# Patient Record
Sex: Male | Born: 1985 | Race: White | Hispanic: No | Marital: Married | Smoking: Current every day smoker
Health system: Southern US, Community
[De-identification: ages and names within clinical notes are randomized; demographics above are authoritative.]

## PROBLEM LIST (undated history)

## (undated) DIAGNOSIS — M75100 Unspecified rotator cuff tear or rupture of unspecified shoulder, not specified as traumatic: Secondary | ICD-10-CM

## (undated) DIAGNOSIS — M24419 Recurrent dislocation, unspecified shoulder: Secondary | ICD-10-CM

## (undated) DIAGNOSIS — R569 Unspecified convulsions: Principal | ICD-10-CM

## (undated) DIAGNOSIS — IMO0002 Reserved for concepts with insufficient information to code with codable children: Secondary | ICD-10-CM

## (undated) HISTORY — DX: Reserved for concepts with insufficient information to code with codable children: IMO0002

## (undated) HISTORY — DX: Unspecified convulsions: R56.9

---

## 2014-07-31 ENCOUNTER — Emergency Department (INDEPENDENT_AMBULATORY_CARE_PROVIDER_SITE_OTHER): Payer: Managed Care, Other (non HMO)

## 2014-07-31 ENCOUNTER — Emergency Department (HOSPITAL_COMMUNITY): Payer: Self-pay

## 2014-07-31 ENCOUNTER — Encounter (HOSPITAL_COMMUNITY): Payer: Self-pay | Admitting: Emergency Medicine

## 2014-07-31 ENCOUNTER — Emergency Department (INDEPENDENT_AMBULATORY_CARE_PROVIDER_SITE_OTHER)
Admission: EM | Admit: 2014-07-31 | Discharge: 2014-07-31 | Disposition: A | Discharge status: Home / Self Care | Payer: Managed Care, Other (non HMO) | Source: Home / Self Care | Attending: Family Medicine | Admitting: Family Medicine

## 2014-07-31 DIAGNOSIS — S4991XA Unspecified injury of right shoulder and upper arm, initial encounter: Secondary | ICD-10-CM

## 2014-07-31 DIAGNOSIS — S43004A Unspecified dislocation of right shoulder joint, initial encounter: Secondary | ICD-10-CM

## 2014-07-31 DIAGNOSIS — S4990XA Unspecified injury of shoulder and upper arm, unspecified arm, initial encounter: Secondary | ICD-10-CM

## 2014-07-31 HISTORY — DX: Unspecified rotator cuff tear or rupture of unspecified shoulder, not specified as traumatic: M75.100

## 2014-07-31 HISTORY — DX: Recurrent dislocation, unspecified shoulder: M24.419

## 2014-07-31 MED ORDER — LORAZEPAM 2 MG/ML IJ SOLN
2.0000 mg | Freq: Once | INTRAMUSCULAR | Status: AC
  Administered 2014-07-31: 2 mg via INTRAMUSCULAR

## 2014-07-31 MED ORDER — MORPHINE SULFATE 2 MG/ML IJ SOLN
4.0000 mg | Freq: Once | INTRAMUSCULAR | Status: AC
  Administered 2014-07-31: 4 mg via INTRAMUSCULAR

## 2014-07-31 MED ORDER — MORPHINE SULFATE 2 MG/ML IJ SOLN
4.0000 mg | Freq: Once | INTRAMUSCULAR | Status: AC
  Administered 2014-07-31: 4 mg via INTRAVENOUS

## 2014-07-31 MED ORDER — MORPHINE SULFATE 2 MG/ML IJ SOLN
INTRAMUSCULAR | Status: AC
  Filled 2014-07-31: qty 2

## 2014-07-31 MED ORDER — LORAZEPAM 2 MG/ML IJ SOLN
INTRAMUSCULAR | Status: AC
  Filled 2014-07-31: qty 1

## 2014-07-31 MED ORDER — LORAZEPAM 1 MG PO TABS: 1.0000 mg | ORAL_TABLET | Freq: Three times a day (TID) | ORAL | Status: DC | PRN

## 2014-07-31 MED ORDER — LORAZEPAM 2 MG/ML IJ SOLN: 2.0000 mg | Freq: Once | INTRAMUSCULAR | Status: DC

## 2014-07-31 NOTE — ED Notes (Signed)
Reports dislocating right shoulder about an hour ago while working on a car.   Hx of rotator cuff injury.

## 2014-07-31 NOTE — Discharge Instructions (Signed)
You dislocated your shoulder. This has successfully been put back in its proper place. Please use the Ativan if the shoulder becomes dislocated again and you're able to put it back in on your own. Please keep the sling on until you see Dr. Ophelia CharterYates. His call his office in the morning to set up an appointment. Please take ibuprofen 600 mg every 6 hours for pain and inflammation. Please apply ice intermittently to your shoulder for the next 24-48 hours.

## 2014-07-31 NOTE — ED Provider Notes (Addendum)
CSN: [id number]     Arrival date & time 07/31/14  1724 History   First MD Initiated Contact with Patient 07/31/14 1748     Chief Complaint  Patient presents with  . Shoulder Injury    dislocated right shoulder.   (Consider location/radiation/quality/duration/timing/severity/associated sxs/prior Treatment) HPI  R shoulder pain: started 1.5 hrs ago. Pt was working on a car and reports pulling down on a wrench w/ his arm in a winged position and felt the shoulder pop out. This is similar to his previous episodes but reports inability this time to pop back in. Immediately painful. Pain is continuous. Has not taken anything for the pain.    History reviewed. No pertinent past medical history. History reviewed. No pertinent past surgical history. History reviewed. No pertinent family history. History  Substance Use Topics  . Smoking status: Current Every Day Smoker -- 0.50 packs/day    Types: Cigarettes  . Smokeless tobacco: Not on file  . Alcohol Use: No    Review of Systems Per HPI with all other pertinent systems negative.   Allergies  Review of patient's allergies indicates no known allergies.  Home Medications   Prior to Admission medications   Not on File   BP 162/110 mmHg  Pulse 72  Temp(Src) 98.3 F (36.8 C) (Oral)  Resp 22  SpO2 100% Physical Exam  Constitutional: He is oriented to person, place, and time. He appears well-developed and well-nourished. He appears distressed.  HENT:  Head: Normocephalic and atraumatic.  Eyes: EOM are normal. Pupils are equal, round, and reactive to light.  Neck: Normal range of motion.  Cardiovascular: Normal rate, normal heart sounds and intact distal pulses.   Pulmonary/Chest: Effort normal and breath sounds normal.  Abdominal: Soft. Bowel sounds are normal.  Musculoskeletal:  Right anterior shoulder distally and anteriorly displaced. Patient unwilling to move arm secondary to pain. Distal pulses, sensation intact. Grip strength  right hand 5 out of 5  Neurological: He is alert and oriented to person, place, and time.  Skin: Skin is warm. He is not diaphoretic.  Psychiatric: He has a normal mood and affect. His behavior is normal. Judgment and thought content normal.   After reduction R shoulder FROM  ED Course  Procedures (including critical care time) Labs Review Labs Reviewed - No data to display  Imaging Review No results found.   MDM   1. Shoulder injury    Anterior shoulder dislocation: 4 mg morphine IM and 2 mg Ativan IM given. An additional 4 mg of morphine was given IM. After manipulation by myself and the patient manipulation after leaving the room. Shoulder went back into place. This was confirmed by x-ray. Discussed case with Dr. Kevan NyGates at [facility] who agrees the patient needs follow-up care and possible surgical intervention at this point. Patient placed in right sling and start this on until he follows up with Dr. Ophelia CharterYates. Patients take NSAIDs for improvement. Ice.  HTN: no previous history. Likely secondary to extreme pain.    Precautions given and all questions answered   Shelly Flattenavid Foy Mungia, MD Family Medicine 07/31/2014, 7:41 PM    Ozella Rocksavid J Austan Nicholl, MD 07/31/14 1941  Ozella Rocksavid J Roberto Romanoski, MD 07/31/14 1946

## 2014-08-14 ENCOUNTER — Emergency Department (HOSPITAL_COMMUNITY): Payer: Managed Care, Other (non HMO)

## 2014-08-14 ENCOUNTER — Emergency Department (HOSPITAL_COMMUNITY)
Admission: EM | Admit: 2014-08-14 | Discharge: 2014-08-15 | Disposition: A | Discharge status: Home / Self Care | Payer: Managed Care, Other (non HMO) | Attending: Emergency Medicine | Admitting: Emergency Medicine

## 2014-08-14 ENCOUNTER — Encounter (HOSPITAL_COMMUNITY): Payer: Self-pay | Admitting: Emergency Medicine

## 2014-08-14 DIAGNOSIS — S43014A Anterior dislocation of right humerus, initial encounter: Secondary | ICD-10-CM | POA: Diagnosis not present

## 2014-08-14 DIAGNOSIS — R410 Disorientation, unspecified: Secondary | ICD-10-CM | POA: Insufficient documentation

## 2014-08-14 DIAGNOSIS — Y998 Other external cause status: Secondary | ICD-10-CM | POA: Diagnosis not present

## 2014-08-14 DIAGNOSIS — Y939 Activity, unspecified: Secondary | ICD-10-CM | POA: Diagnosis not present

## 2014-08-14 DIAGNOSIS — S4991XA Unspecified injury of right shoulder and upper arm, initial encounter: Secondary | ICD-10-CM | POA: Diagnosis present

## 2014-08-14 DIAGNOSIS — R400 Somnolence: Secondary | ICD-10-CM | POA: Insufficient documentation

## 2014-08-14 DIAGNOSIS — R569 Unspecified convulsions: Secondary | ICD-10-CM

## 2014-08-14 DIAGNOSIS — W06XXXA Fall from bed, initial encounter: Secondary | ICD-10-CM | POA: Diagnosis not present

## 2014-08-14 DIAGNOSIS — Y92009 Unspecified place in unspecified non-institutional (private) residence as the place of occurrence of the external cause: Secondary | ICD-10-CM | POA: Insufficient documentation

## 2014-08-14 DIAGNOSIS — Z720 Tobacco use: Secondary | ICD-10-CM | POA: Insufficient documentation

## 2014-08-14 LAB — BASIC METABOLIC PANEL
Anion gap: 7 (ref 5–15)
BUN: 8 mg/dL (ref 6–23)
CALCIUM: 9.1 mg/dL (ref 8.4–10.5)
CO2: 30 mmol/L (ref 19–32)
CREATININE: 0.88 mg/dL (ref 0.50–1.35)
Chloride: 99 mmol/L (ref 96–112)
GFR calc Af Amer: >90 mL/min (ref >90)
GFR calc non Af Amer: >90 mL/min (ref >90)
Glucose, Bld: 121 mg/dL — ABNORMAL HIGH (ref 70–99)
Potassium: 4.3 mmol/L (ref 3.5–5.1)
Sodium: 136 mmol/L (ref 135–145)

## 2014-08-14 LAB — CBC
HCT: 42.7 % (ref 39.0–52.0)
Hemoglobin: 14.5 g/dL (ref 13.0–17.0)
MCH: 29.3 pg (ref 26.0–34.0)
MCHC: 34 g/dL (ref 30.0–36.0)
MCV: 86.3 fL (ref 78.0–100.0)
PLATELETS: 177 10*3/uL (ref 150–400)
RBC: 4.95 MIL/uL (ref 4.22–5.81)
RDW: 13.7 % (ref 11.5–15.5)
WBC: 12.1 10*3/uL — AB (ref 4.0–10.5)

## 2014-08-14 LAB — CBG MONITORING, ED: GLUCOSE-CAPILLARY: 104 mg/dL — AB (ref 70–99)

## 2014-08-14 MED ORDER — LORAZEPAM 2 MG/ML IJ SOLN
0.5000 mg | Freq: Once | INTRAMUSCULAR | Status: AC
  Administered 2014-08-14: 0.5 mg via INTRAVENOUS
  Filled 2014-08-14: qty 1

## 2014-08-14 MED ORDER — HYDROMORPHONE HCL 1 MG/ML IJ SOLN
0.5000 mg | Freq: Once | INTRAMUSCULAR | Status: AC
Start: 2014-08-14 — End: 2014-08-14
  Administered 2014-08-14: 0.5 mg via INTRAVENOUS
  Filled 2014-08-14: qty 1

## 2014-08-14 MED ORDER — HYDROMORPHONE HCL 1 MG/ML IJ SOLN
1.0000 mg | Freq: Once | INTRAMUSCULAR | Status: AC
  Administered 2014-08-14: 1 mg via INTRAVENOUS
  Filled 2014-08-14: qty 1

## 2014-08-14 NOTE — ED Notes (Signed)
Dr Romeo AppleHarrison notified of pain med administration.

## 2014-08-14 NOTE — ED Notes (Addendum)
Patient states that he was a home and had a seizure while watching a movie.  Girlfriend states that he was convulsing, foaming at the mouth, eyes open.  Patient fell off bed.  Patient has a previous injury dislocated right shoulder.  Patient states that he has had one before, a few years ago.  Patient does not have a seizure diagnosis.  Patient also has his right shoulder dislocated at this time.

## 2014-08-14 NOTE — ED Provider Notes (Signed)
CSN: [id number]     Arrival date & time 08/14/14  2111 History   First MD Initiated Contact with Patient 08/14/14 2141     Chief Complaint  Patient presents with  . Seizures  . Shoulder Injury     (Consider location/radiation/quality/duration/timing/severity/associated sxs/prior Treatment) Patient is a 29 y.o. male presenting with seizures and shoulder injury. The history is provided by the patient.  Seizures Seizure activity on arrival: no   Seizure type:  Grand mal Initial focality:  None Episode characteristics: generalized shaking   Postictal symptoms: confusion and somnolence   Return to baseline: yes   Severity:  Mild Duration:  1 minute Timing:  Once Number of seizures this episode:  1 Progression:  Resolved Context comment:  Unknown Recent head injury:  No recent head injuries PTA treatment:  None History of seizures: yes   Similar to previous episodes: yes   Shoulder Injury This is a recurrent problem. The current episode started 1 to 2 hours ago. Episode frequency: once. The problem has not changed since onset.Pertinent negatives include no chest pain, no abdominal pain, no headaches and no shortness of breath. Nothing aggravates the symptoms. Nothing relieves the symptoms. He has tried nothing for the symptoms. The treatment provided no relief.    Past Medical History  Diagnosis Date  . Rotator cuff tear   . Shoulder dislocation, recurrent    History reviewed. No pertinent past surgical history. Family History  Problem Relation Age of Onset  . Adopted: Yes   History  Substance Use Topics  . Smoking status: Current Every Day Smoker -- 0.50 packs/day    Types: Cigarettes  . Smokeless tobacco: Not on file  . Alcohol Use: No    Review of Systems  Constitutional: Negative for fever.  HENT: Negative for drooling and rhinorrhea.   Eyes: Negative for pain.  Respiratory: Negative for cough and shortness of breath.   Cardiovascular: Negative for chest pain and  leg swelling.  Gastrointestinal: Negative for nausea, vomiting, abdominal pain and diarrhea.  Genitourinary: Negative for dysuria and hematuria.  Musculoskeletal: Negative for gait problem and neck pain.  Skin: Negative for color change.  Neurological: Positive for seizures. Negative for numbness and headaches.  Hematological: Negative for adenopathy.  Psychiatric/Behavioral: Negative for behavioral problems.  All other systems reviewed and are negative.     Allergies  Review of patient's allergies indicates no known allergies.  Home Medications   Prior to Admission medications   Medication Sig Start Date End Date Taking? Authorizing Provider  LORazepam (ATIVAN) 1 MG tablet Take 1-2 tablets (1-2 mg total) by mouth every 8 (eight) hours as needed for anxiety (shoulder dislocation). 07/31/14   Ozella Rocks, MD   BP 132/86 mmHg  Pulse 65  Temp(Src) 98 F (36.7 C) (Oral)  Resp 16  SpO2 100% Physical Exam  Constitutional: He is oriented to person, place, and time. He appears well-developed and well-nourished.  HENT:  Head: Normocephalic and atraumatic.  Right Ear: External ear normal.  Left Ear: External ear normal.  Nose: Nose normal.  Mouth/Throat: Oropharynx is clear and moist. No oropharyngeal exudate.  Eyes: Conjunctivae and EOM are normal. Pupils are equal, round, and reactive to light.  Neck: Normal range of motion. Neck supple.  Cardiovascular: Normal rate, regular rhythm, normal heart sounds and intact distal pulses.  Exam reveals no gallop and no friction rub.   No murmur heard. Pulmonary/Chest: Effort normal and breath sounds normal. No respiratory distress. He has no wheezes.  Abdominal: Soft.  Bowel sounds are normal. He exhibits no distension. There is no tenderness. There is no rebound and no guarding.  Musculoskeletal: Normal range of motion. He exhibits tenderness. He exhibits no edema.  Tenderness of the right proximal humerus with evidence of anterior  shoulder dislocation.  Normal motor skills of the right hand. Normal sensation in the right upper extremity. 2+ distal pulses in the upper extremities.  Neurological: He is alert and oriented to person, place, and time.  alert, oriented x3 speech: normal in context and clarity memory: intact grossly cranial nerves II-XII: intact motor strength: full proximally and distally no involuntary movements or tremors sensation: intact to light touch diffusely  cerebellar: finger-to-nose intact gait: normal forwards and backwards   Skin: Skin is warm and dry.  Psychiatric: He has a normal mood and affect. His behavior is normal.  Nursing note and vitals reviewed.   ED Course  Reduction of dislocation Date/Time: 08/15/2014 2:07 PM Performed by: Purvis SheffieldHARRISON, Kellin Fifer Authorized by: Purvis SheffieldHARRISON, Mabell Esguerra Consent: Verbal consent obtained. Written consent obtained. Risks and benefits: risks, benefits and alternatives were discussed Consent given by: patient Patient understanding: patient states understanding of the procedure being performed Patient consent: the patient's understanding of the procedure matches consent given Procedure consent: procedure consent matches procedure scheduled Relevant documents: relevant documents present and verified Test results: test results available and properly labeled Site marked: the operative site was marked Imaging studies: imaging studies available Required items: required blood products, implants, devices, and special equipment available Patient identity confirmed: verbally with patient, arm band, provided demographic data and hospital-assigned identification number Time out: Immediately prior to procedure a "time out" was called to verify the correct patient, procedure, equipment, support staff and site/side marked as required. Preparation: Patient was prepped and draped in the usual sterile fashion. Local anesthesia used: no Patient sedated: yes Sedation type:  moderate (conscious) sedation Sedatives: etomidate Sedation start date/time: 08/15/2014 12:35 PM Sedation end date/time: 08/15/2014 12:44 PM Vitals: Vital signs were monitored during sedation. Patient tolerance: Patient tolerated the procedure well with no immediate complications   (including critical care time) Labs Review Labs Reviewed  CBC - Abnormal; Notable for the following:    WBC 12.1 (*)    All other components within normal limits  BASIC METABOLIC PANEL - Abnormal; Notable for the following:    Glucose, Bld 121 (*)    All other components within normal limits  CBG MONITORING, ED - Abnormal; Notable for the following:    Glucose-Capillary 104 (*)    All other components within normal limits    Imaging Review Dg Shoulder Right  08/15/2014   CLINICAL DATA:  Status post reduction of right humeral head dislocation. Subsequent encounter.  EXAM: RIGHT SHOULDER - 2+ VIEW  COMPARISON:  Right shoulder radiographs performed 08/14/2014  FINDINGS: There has been successful reduction of the right humeral head. A prominent Hill-Sachs lesion is seen. No definite osseous Bankart lesion is identified.  The right acromioclavicular joint is unremarkable in appearance. The visualized portions of the right lung are clear. No significant soft tissue abnormalities are characterized on radiograph.  IMPRESSION: Successful reduction of right humeral head, with prominent Hill-Sachs lesion. No definite osseous Bankart lesion seen.   Electronically Signed   By: Roanna RaiderJeffery  Chang M.D.   On: 08/15/2014 02:28   Dg Shoulder Right  08/14/2014   CLINICAL DATA:  Larey SeatFell out of bed during seizure.  Shoulder pain.  EXAM: RIGHT SHOULDER - 2+ VIEW  COMPARISON:  07/31/2014  FINDINGS: There is an anterior  shoulder dislocation. No evidence for acute fracture. Normal alignment at the right [facility] joint. Visualized right ribs are intact.  IMPRESSION: Anterior right shoulder dislocation.   Electronically Signed   By: Richarda Overlie M.D.   On:  08/14/2014 23:47   Ct Head Wo Contrast  08/15/2014   CLINICAL DATA:  Seizure.  EXAM: CT HEAD WITHOUT CONTRAST  TECHNIQUE: Contiguous axial images were obtained from the base of the skull through the vertex without intravenous contrast.  COMPARISON:  None.  FINDINGS: Skull and Sinuses:Negative for fracture or destructive process. The mastoids, middle ears, and imaged paranasal sinuses are clear of fluid. Prominent right jugular bulb, but likely bone covered.  Orbits: No acute abnormality.  Brain: No evidence of acute infarction, hemorrhage, hydrocephalus, or mass lesion/mass effect. No cortical findings to explain seizure.  IMPRESSION: Negative head CT.   Electronically Signed   By: Marnee Spring M.D.   On: 08/15/2014 00:17     EKG Interpretation   Date/Time:  Tuesday August 14 2014 21:59:03 EST Ventricular Rate:  62 PR Interval:  155 QRS Duration: 87 QT Interval:  396 QTC Calculation: 402 R Axis:   51 Text Interpretation:  Sinus rhythm Early repolarization Confirmed by  Avaline Stillson  MD, Albie Arizpe ([id number]) on 08/14/2014 10:05:02 PM      MDM   Final diagnoses:  Seizure  Anterior shoulder dislocation, right, initial encounter    9:56 PM 29 y.o. male with history of right anterior shoulder dislocations and one previous seizure who presents with a seizure and shoulder dislocation. His girlfriend states that he was lying on the bed with her around 745 this evening when he began shaking all over and fell to the ground. She states that she caught his head. He appears to have dislocated his right shoulder. He has a mild headache, 3 out of 10. He notes that he had one previous seizure 2 years ago while fishing but never sought medical care. He denies any sleep issues or recent head injuries. He states that he is otherwise been well. Screening lab work sent prior to my evaluation. Will get imaging of head and right shoulder.   Several attempts were made at shoulder reduction w/ dilaudid and mild  anxiolysis. These attempts were unsuccessful and ultimately the pt required conscious sedation. The shoulder was easily reduced under conscious sedation. Pt found to have hill sachs lesion. Already has f/u w/ ortho this month. Placed in sling. He remains at baseline after the sedation has worn off. Will rec neuro f/u and advised him no driving or operation of heavy machinery until cleared by a neurologist.  I have discussed the diagnosis/risks/treatment options with the patient and believe the pt to be eligible for discharge home to follow-up with neuro and ortho. We also discussed returning to the ED immediately if new or worsening sx occur. We discussed the sx which are most concerning (e.g., recurrent seizures, AMS, fever) that necessitate immediate return. Medications administered to the patient during their visit and any new prescriptions provided to the patient are listed below.  Medications given during this visit Medications  HYDROmorphone (DILAUDID) injection 1 mg (1 mg Intravenous Given 08/14/14 2211)  LORazepam (ATIVAN) injection 0.5 mg (0.5 mg Intravenous Given 08/14/14 2210)  LORazepam (ATIVAN) injection 0.5 mg (0.5 mg Intravenous Given 08/14/14 2314)  HYDROmorphone (DILAUDID) injection 0.5 mg (0.5 mg Intravenous Given 08/14/14 2314)  etomidate (AMIDATE) injection (8 mg Intravenous Given 08/15/14 0034)    Discharge Medication List as of 08/15/2014 12:21 AM  START taking these medications   Details  oxyCODONE-acetaminophen (PERCOCET) 5-325 MG per tablet Take 1 tablet by mouth every 6 (six) hours as needed for moderate pain., Starting 08/15/2014, Until Discontinued, Print         Purvis Sheffield, MD 08/15/14 [phone]

## 2014-08-14 NOTE — ED Notes (Signed)
Dr Romeo AppleHarrison in room to attempt shoulder relocation

## 2014-08-14 NOTE — ED Notes (Signed)
Dr Harrison in room to attempt shoulder relocation 

## 2014-08-14 NOTE — ED Notes (Signed)
Called x-ray and confirmed that pt is ready for x-ray

## 2014-08-14 NOTE — ED Notes (Signed)
MD at bedside. 

## 2014-08-15 ENCOUNTER — Emergency Department (HOSPITAL_COMMUNITY): Payer: Managed Care, Other (non HMO)

## 2014-08-15 MED ORDER — OXYCODONE-ACETAMINOPHEN 5-325 MG PO TABS: 1.0000 | ORAL_TABLET | Freq: Four times a day (QID) | ORAL | Status: DC | PRN

## 2014-08-15 MED ORDER — ETOMIDATE 2 MG/ML IV SOLN
12.0000 mg | Freq: Once | INTRAVENOUS | Status: DC
  Filled 2014-08-15: qty 10

## 2014-08-15 MED ORDER — ETOMIDATE 2 MG/ML IV SOLN
INTRAVENOUS | Status: AC | PRN
  Administered 2014-08-15: 8 mg via INTRAVENOUS

## 2014-08-15 NOTE — ED Notes (Signed)
Pt verbalized understanding of d/c instructions and prescriptions and has no further questions.

## 2014-08-15 NOTE — ED Notes (Signed)
Dr Norlene Campbelltter in room with Dr Romeo AppleHarrison to attempt relocation with this RN

## 2014-08-15 NOTE — Discharge Instructions (Signed)
Dislocation or Subluxation °Dislocation of a joint occurs when ends of two or more adjacent bones no longer touch each other. A subluxation is a minor form of a dislocation, in which two or more adjacent bones are no longer properly aligned. The most common joints susceptible to a dislocation are the shoulder, kneecap, and fingers.  °SYMPTOMS  °· Sudden pain at the time of injury. °· Noticeable deformity in the area of the joint. °· Limited range of motion. °CAUSES  °· Usually a traumatic injury that stretches or tears ligaments that surround a joint and hold the bones together. °· Condition present at birth (congenital) in which the joint surfaces are shallow or abnormally formed. °· Joint disease such as arthritis or other diseases of ligaments and tissues around a joint. °RISK INCREASES WITH: °· Repeated injury to a joint. °· Previous dislocation of a joint. °· Contact sports (football, rugby, hockey, lacrosse) or sports that require repetitive overhead arm motion (throwing, swimming, volleyball). °· Rheumatoid arthritis. °· Congenital joint condition. °PREVENTION °· Warm up and stretch properly before activity. °· Maintain physical fitness: °¨ Joint flexibility. °¨ Muscle strength and endurance. °¨ Cardiovascular fitness. °· Wear proper protective equipment and ensure correct fit. °· Learn and use proper technique. °PROGNOSIS  °This condition is usually curable with prompt treatment. After the dislocation has been put back in place, the joint may require immobilization with a cast, splint, or sling for 2 to 6 weeks, often followed by strength and stretching exercises that may be performed at home or with a therapist. °RELATED COMPLICATIONS  °· Damage to nearby nerves or major blood vessels, causing numbness, coldness, or paleness. °· Recurrent injury to the joint. °· Arthritis of affected joint. °· Fracture of joint. °TREATMENT °Treatment initially involves realigning the bones (reduction) of the joint.  Reductions should only be performed by someone who is trained in the procedure. After the joint is reduced, medicine and ice should be used to reduce pain and inflammation. The joint may be immobilized to allow for the muscles and ligaments to heal. If a joint is subjected to recurrent dislocations, surgery may be necessary to tighten or replace injured structures. After surgery, stretching and strengthening exercises may be required. These may be performed at home or with a therapist. °MEDICATION  °· Patients may require medicine to help them relax (sedative) or muscle relaxants in order to reduce the joint. °· If pain medicine is necessary, nonsteroidal anti-inflammatory medicines, such as aspirin and ibuprofen, or other minor pain relievers, such as acetaminophen, are often recommended. °· Do not take pain medicine for 7 days before surgery. °· Prescription pain relievers may be necessary. Use only as directed and only as much as you need. °SEEK MEDICAL CARE IF:  °· Symptoms get worse or do not improve despite treatment. °· You have difficulty moving a joint after injury. °· Any extremity becomes numb, pale, or cool after injury. This is an emergency. °· Dislocations or subluxations occur repeatedly. °Document Released: 06/01/2005 Document Revised: 08/24/2011 Document Reviewed: 09/13/2008 °[employer]® Patient Information ©2015 [employer]. This information is not intended to replace advice given to you by your health care provider. Make sure you discuss any questions you have with your health care provider. ° °

## 2014-08-16 ENCOUNTER — Ambulatory Visit (INDEPENDENT_AMBULATORY_CARE_PROVIDER_SITE_OTHER): Payer: Managed Care, Other (non HMO) | Admitting: Neurology

## 2014-08-16 ENCOUNTER — Encounter: Payer: Self-pay | Admitting: Neurology

## 2014-08-16 ENCOUNTER — Telehealth: Payer: Self-pay | Admitting: *Deleted

## 2014-08-16 VITALS — BP 124/79 | HR 56 | Ht 70.5 in | Wt 168.6 lb

## 2014-08-16 DIAGNOSIS — R569 Unspecified convulsions: Secondary | ICD-10-CM

## 2014-08-16 DIAGNOSIS — G40309 Generalized idiopathic epilepsy and epileptic syndromes, not intractable, without status epilepticus: Secondary | ICD-10-CM | POA: Diagnosis not present

## 2014-08-16 DIAGNOSIS — IMO0002 Reserved for concepts with insufficient information to code with codable children: Secondary | ICD-10-CM

## 2014-08-16 HISTORY — DX: Unspecified convulsions: R56.9

## 2014-08-16 HISTORY — DX: Reserved for concepts with insufficient information to code with codable children: IMO0002

## 2014-08-16 MED ORDER — LEVETIRACETAM ER 750 MG PO TB24: ORAL_TABLET | ORAL | Status: DC

## 2014-08-16 NOTE — Progress Notes (Signed)
Provider:  Melvyn Novas, M D  Referring Provider: No ref. provider found Primary Care Physician:  Minda Meo, MD  Chief Complaint  Patient presents with  . NP Bobby Gallagher Seizure    Rm 10, wife    HPI:  Bobby Gallagher is a 29 y.o. male seen here as a referral  from Dr. [name], MD for evaluation of his second seizure.    This patient had his first seizure approximately 2 years ago he was casting a right fitting in the presence of friends and they noticed that he spun around and seemed to have landed on his right shoulder. He was witnessed to have several convulsions for about 4 minutes he was on unresponsive and then slowly came back to. When he finally regained awareness of his situation the paramedics had already been at the site. He may have may have had a prostatectomy for 10-20 minutes afterwards. He was very combative,  feeling threatened in that situation.  Then , 2 days ago,  he suffered his second seizure. The shoulder injury that he reported occurred likely first during his first seizure 2 years ago as he fell onto his right side. He dislocated the shoulder later at his workplace had a non- surgical relocation  and treatment and during Tuesday's  Seizure dislocated again his shoulder and injured it even further. He reports that he has joint pain now aching muscles he also feels fatigue does not get enough sleep or does not feel rested and restored. He reports he is snoring. He has a long-standing history of insomnia which is not bothering him right now. His main problem today is his second seizure so far without any trigger being identified any preceding illness being identified any factors that may have caused it. Laboratory results dated just after midnight on 08-15-14 show an elevated blood Cell count slightly elevated glucose which is expected after a seizure. He received moderate conscious sedation with etomidate. And then his shoulder was relocated once again. He will see  his orthopedist tomorrow morning on 08-17-14.  The seizure occurred at 19:36 on 08-14-14, and would witnessed by his wife, who confirmed that the patient did not have a fever actually had come home at a regular time after work and had gone to bed rather early. He did not get sleep deprived, he did not enjoy any alcohol or any other recreational drugs. There was also no history of being exposed to any flashlights, he had just been watching a movie in the evening. Of course the patient was advised to see a neurologist before driving or operating heavy machinery again.  The patient is adopted, no family history of seizures.   Review of Systems: Out of a complete 14 system review, the patient complains of only the following symptoms, and all other reviewed systems are negative.   History   Social History  . Marital Status: Married    Spouse Name: N/A  . Number of Children: 0  . Years of Education: voc colleg   Occupational History  . [employer]    Social History Main Topics  . Smoking status: Current Every Day Smoker -- 0.50 packs/day    Types: Cigarettes  . Smokeless tobacco: Not on file  . Alcohol Use: No  . Drug Use: Yes    Special: Marijuana     Comment: rare  . Sexual Activity: Yes   Other Topics Concern  . Not on file   Social History Narrative   Caffeine  1-2 cups daily.    Family History  Problem Relation Age of Onset  . Adopted: Yes    Past Medical History  Diagnosis Date  . Rotator cuff tear   . Shoulder dislocation, recurrent   . Seizures 08/16/2014    [name] had a first seizure about springtime 2014 and suffered a second isolated seizure yesterday. Just a week earlier he had been treated for dislocated shoulder and with a seizure fell and we dislocated the injured shoulder. 08-16-14     History reviewed. No pertinent past surgical history.  Current Outpatient Prescriptions  Medication Sig Dispense Refill  . oxyCODONE-acetaminophen (PERCOCET) 5-325 MG per  tablet Take 1 tablet by mouth every 6 (six) hours as needed for moderate pain. 15 tablet 0   No current facility-administered medications for this visit.    Allergies as of 08/16/2014 - Review Complete 08/16/2014  Allergen Reaction Noted  . Tramadol hcl  08/16/2014    Vitals: BP 124/79 mmHg  Pulse 56  Ht 5' 10.5" (1.791 m)  Wt 168 lb 9.6 oz (76.476 kg)  BMI 23.84 kg/m2 Last Weight:  Wt Readings from Last 1 Encounters:  08/16/14 168 lb 9.6 oz (76.476 kg)   Last Height:   Ht Readings from Last 1 Encounters:  08/16/14 5' 10.5" (1.791 m)    Physical exam:  General: The patient is awake, alert and appears not in acute distress. The patient is well groomed. Head: Normocephalic, atraumatic. Neck is supple. Mallampati 3  neck circumference: 14.5 , facial hair.  Cardiovascular:  Regular rate and rhythm, without  murmurs or carotid bruit, and without distended neck veins. Respiratory: Lungs are clear to auscultation. Skin:  Without evidence of edema, or rash Trunk: BMI is elevated and patient  has normal posture.  Neurologic exam : The patient is awake and alert, oriented to place and time.  Memory subjective  described as intact.  There is a normal attention span & concentration ability. Speech is fluent without dysarthria, dysphonia or aphasia.  Mood and affect are appropriate.  Cranial nerves: Pupils are equal and briskly reactive to light. Funduscopic exam without   evidence of pallor or edema. Extraocular movements  in vertical and horizontal planes intact and without nystagmus.  Visual fields by finger perimetry are intact. Hearing to finger rub intact.  Facial sensation intact to fine touch. Facial motor strength is symmetric and tongue and uvula move midline.  Tongue protrusion into either cheek is normal. Shoulder shrug is normal.   Motor exam:   Normal tone ,muscle bulk and symmetric  Strength, except for the injured right upper extremity.   Sensory:  Fine touch,  pinprick and vibration were tested in all extremities. Proprioception was normal.  Coordination: Rapid alternating movements in the fingers/hands were normal. Finger-to-nose maneuver  normal without evidence of ataxia, dysmetria or tremor.  Gait and station: Patient walks without assistive device and is able unassisted to climb up to the exam table.  Strength within normal limits. Stance is stable and normal. Tandem gait is unfragmented. Romberg testing is negative   Deep tendon reflexes: in the  upper and lower extremities are symmetric and intact. Babinski maneuver response is  downgoing.   Assessment:  After physical and neurologic examination, review of laboratory studies, imaging, neurophysiology testing and pre-existing records, assessment is that of 30 minutes.  I explained to the patient today that since he had 2 seizures within less than 2 years I suspect that he will have a seizure disorder. There has  been no trigger found and I hope that we can see through the MRI brain if any anatomical correlation exists. We will have to put special attention to the hippocampal area and the mesial temporal lobe. I advised him that [place] state law requires him not to operate machinery usually for 6 months. There are extensions for patients that experience Lindell Spar only nocturnal seizures, or patients that  have been treated  For focal seizure with medication successfully without return of seizures and agreed to a limited residential daytime driving.  New onset seizure , number 2 in 20 month.  No EEG and no MRI yet done, CT reviewed and report reviewed and discussed with the patient - negative, Labs were negative.    Plan:  Treatment plan and additional workup :  EEG 40 minutes with lights and HV, MRI with and without contrast with seizure protocol.   Rv in 14 days with me, 30 minutes.   Porfirio Mylar Michala Deblanc MD 08/16/2014

## 2014-08-16 NOTE — Telephone Encounter (Signed)
Bobby SmallGloria Gallagher, referral coordinator calling about appt with Dr. Vickey Hugerohmeier today concerning new onset seizures on pt at Dr. Lanell MatarAronson's request.

## 2014-08-21 ENCOUNTER — Ambulatory Visit (INDEPENDENT_AMBULATORY_CARE_PROVIDER_SITE_OTHER): Payer: Managed Care, Other (non HMO) | Admitting: Neurology

## 2014-08-21 DIAGNOSIS — R569 Unspecified convulsions: Secondary | ICD-10-CM

## 2014-08-21 DIAGNOSIS — G40309 Generalized idiopathic epilepsy and epileptic syndromes, not intractable, without status epilepticus: Secondary | ICD-10-CM

## 2014-08-21 DIAGNOSIS — IMO0002 Reserved for concepts with insufficient information to code with codable children: Secondary | ICD-10-CM

## 2014-08-23 ENCOUNTER — Ambulatory Visit (INDEPENDENT_AMBULATORY_CARE_PROVIDER_SITE_OTHER): Payer: Managed Care, Other (non HMO)

## 2014-08-23 DIAGNOSIS — R569 Unspecified convulsions: Secondary | ICD-10-CM | POA: Diagnosis not present

## 2014-08-23 DIAGNOSIS — G40309 Generalized idiopathic epilepsy and epileptic syndromes, not intractable, without status epilepticus: Secondary | ICD-10-CM | POA: Diagnosis not present

## 2014-08-23 DIAGNOSIS — IMO0002 Reserved for concepts with insufficient information to code with codable children: Secondary | ICD-10-CM

## 2014-08-23 MED ORDER — GADOPENTETATE DIMEGLUMINE 469.01 MG/ML IV SOLN: 17.0000 mL | Freq: Once | INTRAVENOUS | Status: AC | PRN

## 2014-08-24 NOTE — Procedures (Signed)
[facility]  EEG (ELECTROENCEPHALOGRAM) REPORT   STUDY DATE:  08-21-14  PATIENT NAME: Bobby Gallagher,  [name]  , DOB 1985-08-03  MRN: [id number]   ORDERING CLINICIAN: Melvyn Novasarmen Wylene Weissman, MD   TECHNOLOGIST:  Yetta BarreJones  TECHNIQUE: Electroencephalogram was recorded utilizing standard 10-20 system of lead placement and reformatted into average and bipolar montages.      RECORDING TIME: This is a prolonged EEG recording at over 40 minutes duration. It includes hyperventilation and photic stimulation.     CLINICAL INFORMATION:  Mr. Baird LyonsCito suffered a first seizure 20 months ago while fishing. It was noted that he had a portion movement he fell onto his right shoulder but his left arm was tonically extended. He was postictally confused the seizure itself was reported as lasting perhaps 2-4 minutes.  A new seizure was now witnessed by his wife 08-18-14. Similar  movements were seen and the patient again fell onto his right shoulder, which was dislocated in this fall.  FINDINGS: EEG  posterior dominant background rhythm of 9 Hz. There is a high amount of beta fast activity noted at the same time a rather low amplitude is seen. This is most likely a benzodiazepine effect. Photic entrainment was noted at 7, 9, 11, and 13 Hz. Photic stimulation did not lead to epileptiform activity asymmetry or focal slowing. As the patient became increasingly drowsy it was noted that bolus central hemispheres produced phase reversal activity this is a sign of sleepiness and not epileptiform. These were nonrhythmic non-periodic discharges. They were followed by K complexes. The patient achieved only a brief period of sleep. His cardiac rhythm remained in normal sinus rhythm at 60 bpm   IMPRESSION:  EEG is normal for wake and sleep .   MRI should  be correlated.        Melvyn Novasarmen Irmgard Rampersaud , MD

## 2014-08-27 NOTE — Telephone Encounter (Signed)
Appt made 08-16-14.

## 2014-09-03 ENCOUNTER — Ambulatory Visit (INDEPENDENT_AMBULATORY_CARE_PROVIDER_SITE_OTHER): Payer: Managed Care, Other (non HMO) | Admitting: Neurology

## 2014-09-03 ENCOUNTER — Encounter: Payer: Self-pay | Admitting: Neurology

## 2014-09-03 VITALS — BP 139/82 | HR 84 | Resp 16 | Ht 70.0 in | Wt 162.6 lb

## 2014-09-03 DIAGNOSIS — G40209 Localization-related (focal) (partial) symptomatic epilepsy and epileptic syndromes with complex partial seizures, not intractable, without status epilepticus: Secondary | ICD-10-CM | POA: Diagnosis not present

## 2014-09-03 MED ORDER — LAMOTRIGINE ER 100 MG PO TB24: 100.0000 mg | ORAL_TABLET | ORAL | Status: AC

## 2014-09-03 MED ORDER — LAMOTRIGINE ER 100 MG PO TB24: 100.0000 mg | ORAL_TABLET | ORAL | Status: DC

## 2014-09-03 MED ORDER — LAMOTRIGINE 42 X 25 MG & 7 X 100 MG PO KIT: 25.0000 mg | PACK | Freq: Every morning | ORAL | Status: DC

## 2014-09-03 MED ORDER — LAMOTRIGINE ER 25 & 50 & 100 MG PO KIT: PACK | ORAL | Status: AC

## 2014-09-03 NOTE — Progress Notes (Signed)
Quick Note:  Pt here for follow up today. Results given by Dr. Vickey Hugerohmeier. ______

## 2014-09-03 NOTE — Addendum Note (Signed)
Addended by: Melvyn NovasHMEIER, Demaya Hardge on: 09/03/2014 03:26 PM   Modules accepted: Orders

## 2014-09-03 NOTE — Patient Instructions (Signed)
Lamotrigine extended-release tablets What is this medicine? LAMOTRIGINE (la MOE Patrecia Pacetri jeen) is used to control seizures in adults and children with epilepsy. This medicine may be used for other purposes; ask your health care provider or pharmacist if you have questions. COMMON BRAND NAME(S): Lamictal XR What should I tell my health care provider before I take this medicine? They need to know if you have any of these conditions: -a history of depression or bipolar disorder -aseptic meningitis during prior use of lamotrigine -folate deficiency -kidney disease -liver disease -suicidal thoughts, plans, or attempt; a previous suicide attempt by you or a family member -an unusual or allergic reaction to lamotrigine or other seizure medicines, other medicines, foods, dyes, or preservatives -pregnant or trying to get pregnant -breast-feeding How should I use this medicine? Take this medicine by mouth with a glass of water. Follow the directions on the prescription label. Swallow these tablets whole. Do not chew, crush, or divide them. If this medicine upsets your stomach, take it with food or milk. Take your doses at regular intervals. Do not take your medicine more often than directed. A special MedGuide will be given to you by the pharmacist with each new prescription and refill. Be sure to read this information carefully each time. Talk to your pediatrician regarding the use of this medicine in children. While this drug may be prescribed for children as young as 28 years of age, precautions do apply. Overdosage: If you think you've taken too much of this medicine contact a poison control center or emergency room at once. Overdosage: If you think you have taken too much of this medicine contact a poison control center or emergency room at once. NOTE: This medicine is only for you. Do not share this medicine with others. What if I miss a dose? If you miss a dose, take it as soon as you can. If it is  almost time for your next dose, take only that dose. Do not take double or extra doses. What may interact with this medicine? -carbamazepine -male hormones, including contraceptive or birth control pills -methotrexate -phenobarbital -phenytoin -primidone -pyrimethamine -rifampin -trimethoprim -valproic acid This list may not describe all possible interactions. Give your health care provider a list of all the medicines, herbs, non-prescription drugs, or dietary supplements you use. Also tell them if you smoke, drink alcohol, or use illegal drugs. Some items may interact with your medicine. What should I watch for while using this medicine? Visit your doctor or health care professional for regular checks on your progress. Wear a [employer] Alert bracelet or necklace. Carry an identification card with information about your condition, medicines, and doctor or health care professional. It is important to take this medicine exactly as directed. When first starting treatment, your dose will need to be adjusted slowly. It may take weeks or months before your dose is stable. You should contact your doctor or health care professional if your seizures get worse or if you have any new types of seizures. Do not stop taking this medicine unless instructed by your doctor or health care professional. Stopping your medicine suddenly can increase your seizures or their severity. Contact your doctor or health care professional right away if you develop a rash while taking this medicine. Rashes may be very severe and sometimes require treatment in the hospital. Deaths from rashes have occurred. Serious rashes occur more often in children than adults taking this medicine. It is more common for these serious rashes to occur during the first  2 months of treatment, but a rash can occur at any time. You may get drowsy, dizzy, or have blurred vision. Do not drive, use machinery, or do anything that needs mental alertness until  you know how this medicine affects you. To reduce dizzy or fainting spells, do not sit or stand up quickly, especially if you are an older patient. Alcohol can increase drowsiness and dizziness. Avoid alcoholic drinks. The use of this medicine may increase the chance of suicidal thoughts or actions. Pay special attention to how you are responding while on this medicine. Any worsening of mood, or thoughts of suicide or dying should be reported to your health care professional right away. Your mouth may get dry. Chewing sugarless gum or sucking hard candy, and drinking plenty of water may help. Contact your doctor if the problem does not go away or is severe. Women who become pregnant while using this medicine may enroll in the [place] American Antiepileptic Drug Pregnancy Registry by calling [phone]. This registry collects information about the safety of antiepileptic drug use during pregnancy. What side effects may I notice from receiving this medicine? Side effects you should report to your doctor or health care professional as soon as possible: -allergic reactions like skin rash, itching or hives, swelling of the face, lips, or tongue -blurred or double vision -difficulty walking or controlling muscle movements -fever -headache, stiff neck, and sensitivity to light -painful sores in the mouth, eyes, or nose -redness, blistering, peeling or loosening of the skin, including inside the mouth -severe muscle pain -swollen lymph glands -uncontrollable eye movements -unusual bruising or bleeding -unusually weak or tired -vomiting -worsening of mood, thoughts or actions of suicide or dying -yellowing of the eyes or skin Side effects that usually do not require medical attention (report to your doctor or health care professional if they continue or are bothersome): -diarrhea, or constipation -difficulty sleeping -nausea -tremors This list may not describe all possible side effects. Call your  doctor for medical advice about side effects. You may report side effects to FDA at 1-800-FDA-1088. Where should I keep my medicine? Keep out of reach of children. Store at room temperature between 15 and 30 degrees C (59 and 86 degrees F). Throw away any unused medicine after the expiration date. NOTE: This sheet is a summary. It may not cover all possible information. If you have questions about this medicine, talk to your doctor, pharmacist, or health care provider.  2015, [employer]/Gold Standard. (2010-06-04 12:18:31)

## 2014-09-03 NOTE — Progress Notes (Signed)
Provider:  Melvyn Novas, M D  Referring Provider: Geoffry Paradise, MD Primary Care Physician:  Minda Meo, MD  Chief Complaint  Patient presents with  . RV Seizures    Rm 10, wife    HPI:  Bobby Gallagher is a 29 y.o. male seen here as a referral  from Dr. [name], MD for evaluation of his second seizure.    This patient had his first seizure approximately 2 years ago he was casting a right fitting in the presence of friends and they noticed that he spun around and seemed to have landed on his right shoulder. He was witnessed to have several convulsions for about 4 minutes he was on unresponsive and then slowly came back to. When he finally regained awareness of his situation the paramedics had already been at the site. He may have may have had a prostatectomy for 10-20 minutes afterwards. He was very combative,  feeling threatened in that situation.  Then , 2 days ago,  he suffered his second seizure. The shoulder injury that he reported occurred likely first during his first seizure 2 years ago as he fell onto his right side. He dislocated the shoulder later at his workplace had a non- surgical relocation  and treatment and during Tuesday's  Seizure dislocated again his shoulder and injured it even further. He reports that he has joint pain now aching muscles he also feels fatigue does not get enough sleep or does not feel rested and restored. He reports he is snoring. He has a long-standing history of insomnia which is not bothering him right now. His main problem today is his second seizure so far without any trigger being identified any preceding illness being identified any factors that may have caused it. Laboratory results dated just after midnight on 08-15-14 show an elevated blood Cell count slightly elevated glucose which is expected after a seizure. He received moderate conscious sedation with etomidate. And then his shoulder was relocated once again. He will see his  orthopedist tomorrow morning on 08-17-14.  The seizure occurred at 19:36 on 08-14-14, and would witnessed by his wife, who confirmed that the patient did not have a fever actually had come home at a regular time after work and had gone to bed rather early. He did not get sleep deprived, he did not enjoy any alcohol or any other recreational drugs. There was also no history of being exposed to any flashlights, he had just been watching a movie in the evening. Of course the patient was advised to see a neurologist before driving or operating heavy machinery again.  The patient is adopted, no family history of seizures.  Assessment:  After physical and neurologic examination, review of laboratory studies, imaging, neurophysiology testing and pre-existing records, assessment is that of 30 minutes.  I explained to the patient today that since he had 2 seizures within less than 2 years I suspect that he will have a seizure disorder. There has been no trigger found and I hope that we can see through the MRI brain if any anatomical correlation exists. We will have to put special attention to the hippocampal area and the mesial temporal lobe. I advised him that [place] state law requires him not to operate machinery usually for 6 months. There are extensions for patients that experience Lindell Spar only nocturnal seizures, or patients that  have been treated  For focal seizure with medication successfully without return of seizures and agreed to a limited residential  daytime driving. New onset seizure , number 2 in 20 month.    Several history 09-03-14, Bobby Gallagher did not tolerate Keppra levetiracetam very well and he feels that is causing him to feel in a unrest he feels to some degree on shorter tempered his EEG was surprisingly and highly normal. The patient achieved only a brief period of sleep and his cardiac rhythm remained in normal sinus rhythm. His MRI was equally normal. Strokes, scars from traumatic brain  injury or 2 more have been ruled out.  My plan is to change Bobby Gallagher from Keppra to Lamictal. A bit less much less likely to cause mood swings, but it will need about 6 weeks to reach a therapeutic level. In addition I would like for the patient to have some low-dose benzodiazepine at home that could be that he could carry even to work with him. I would use would usually use a sublingual Ativan or Xanax for this. These dissolved in the  Buccal tissue.   Review of Systems: Out of a complete 14 system review, the patient complains of only the following symptoms, and all other reviewed systems are negative.  Last 14 days as expected no further seizure activity.   History   Social History  . Marital Status: Married    Spouse Name: N/A  . Number of Children: 0  . Years of Education: voc colleg   Occupational History  . [employer]    Social History Main Topics  . Smoking status: Current Every Day Smoker -- 0.50 packs/day    Types: Cigarettes  . Smokeless tobacco: Not on file  . Alcohol Use: No  . Drug Use: Yes    Special: Marijuana     Comment: rare  . Sexual Activity: Yes   Other Topics Concern  . Not on file   Social History Narrative   Caffeine 1-2 cups daily.    Family History  Problem Relation Age of Onset  . Adopted: Yes    Past Medical History  Diagnosis Date  . Rotator cuff tear   . Shoulder dislocation, recurrent   . Seizures 08/16/2014    Bobby Gallagher had a first seizure about springtime 2014 and suffered a second isolated seizure yesterday. Just a week earlier he had been treated for dislocated shoulder and with a seizure fell and we dislocated the injured shoulder. 08-16-14   . 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 No pertinent past surgical history.  Current Outpatient Prescriptions  Medication Sig Dispense Refill  . Levetiracetam (KEPPRA XR) 750 MG TB24 Take 1 tab q day for 14 days, than increase to bid po. 60 tablet 3   No  current facility-administered medications for this visit.    Allergies as of 09/03/2014 - Review Complete 09/03/2014  Allergen Reaction Noted  . Tramadol hcl  08/16/2014    Vitals: BP 139/82 mmHg  Pulse 84  Resp 16  Ht  (1.778 m)  Wt 162 lb 9.6 oz (73.755 kg)  BMI 23.33 kg/m2 Last Weight:  Wt Readings from Last 1 Encounters:  09/03/14 162 lb 9.6 oz (73.755 kg)   Last Height:   Ht Readings from Last 1 Encounters:  09/03/14  (1.778 m)    Physical exam: unchanged since last visit.   General: The patient is awake, alert and appears not in acute distress. The patient is well groomed. Head: Normocephalic, atraumatic. Neck is supple. Mallampati 3  neck circumference: 14.5 with  facial hair.  Cardiovascular:  Regular rate and rhythm, without  murmurs or carotid bruit, and without distended neck veins. Respiratory: Lungs are clear to auscultation. Skin:  Without evidence of edema, or rash Trunk: BMI is elevated and patient  has normal posture.  Neurologic exam : The patient is awake and alert, oriented to place and time. Memory subjective  described as intact.  There is a normal attention span & concentration ability. Speech is fluent without dysarthria, dysphonia or aphasia.  Mood and affect are appropriate.  Cranial nerves: Pupils are equal and briskly reactive to light. Extraocular movements  in vertical and horizontal planes intact and without nystagmus.  Visual fields by finger perimetry are intact. Hearing to finger rub intact.  Facial sensation intact to fine touch. Facial motor strength is symmetric and tongue and uvula move midline. Tongue protrusion into either cheek is normal. Shoulder shrug is normal.   Motor exam:   Normal tone ,muscle bulk and symmetric  Strength, except for the injured right upper extremity.  Sensory:  Fine touch, pinprick and vibration were tested in all extremities. Proprioception was normal. Coordination: Rapid alternating movements  in the fingers/hands were normal. Finger-to-nose maneuver  normal without evidence of ataxia, dysmetria or tremor. Gait and station: Patient walks without assistive device and is able unassisted to climb up to the exam table. Strength within normal limits. Stance is stable and normal. Tandem gait is unfragmented. Romberg testing is negative  Deep tendon reflexes: in the  upper and lower extremities are symmetric and intact. Babinski maneuver response is downgoing.   Plan:  Treatment plan and additional workup :  Bobby Gallagher had 2 seizures in total and pulse were 20 month apart I predict that he is not at a high risk of having yet another seizure especially since the EEG and MRI were normal. However guidelines suggest that the patient should be placed on an antiepileptic medication after a second seizure. I will change him from levetiracetam Keppra, which  he has discontinued it 2 days ago and start him on Lamictal with a starter pack.   Rv in 2 month with NP or me.   Porfirio Mylararmen Jaysa Kise MD 09/03/2014

## 2014-09-13 ENCOUNTER — Telehealth: Payer: Self-pay | Admitting: *Deleted

## 2014-09-13 NOTE — Telephone Encounter (Signed)
[employer] calling for reasons why patient was out of work, diagnosis for office visit, treatment plans, as well as the status of the form. Refernce # [id number]

## 2014-09-14 NOTE — Telephone Encounter (Signed)
Per Deeann Creeonna S in MR, awaiting payment.

## 2014-09-19 ENCOUNTER — Telehealth: Payer: Self-pay | Admitting: Neurology

## 2014-09-19 NOTE — Telephone Encounter (Signed)
I called and spoke to [facility] [place]  Gave him information re: to pt.  We have seen for seizures.  Had shoulder injury from fall of seizure.  Form to be signed when Dr. Vickey Hugerohmeier returns re: to seizures.  Ortho relating to shoulder injury.

## 2014-09-19 NOTE — Telephone Encounter (Signed)
Faylene MillionVance, RN with [employer] @ [phone], requesting additional information for disability form.  Needing dx's code, treatment plan, OV notes from 08/16/14, and attending Physicians form.  Ref # [id number], please call and adivse.

## 2014-12-04 ENCOUNTER — Ambulatory Visit: Payer: Managed Care, Other (non HMO) | Admitting: Neurology

## 2014-12-04 ENCOUNTER — Telehealth: Payer: Self-pay

## 2014-12-04 NOTE — Telephone Encounter (Signed)
Pt did not show for appt with Dr. Vickey Huger today.

## 2015-03-24 IMAGING — CR DG SHOULDER 2+V*R*
2 series · 2 of 2 positions shown · non-contrast
Comparison: 07/31/2014

CLINICAL DATA: Fell out of bed during seizure.  Shoulder pain.

EXAM:
RIGHT SHOULDER - 2+ VIEW

[x shoulder ap right]
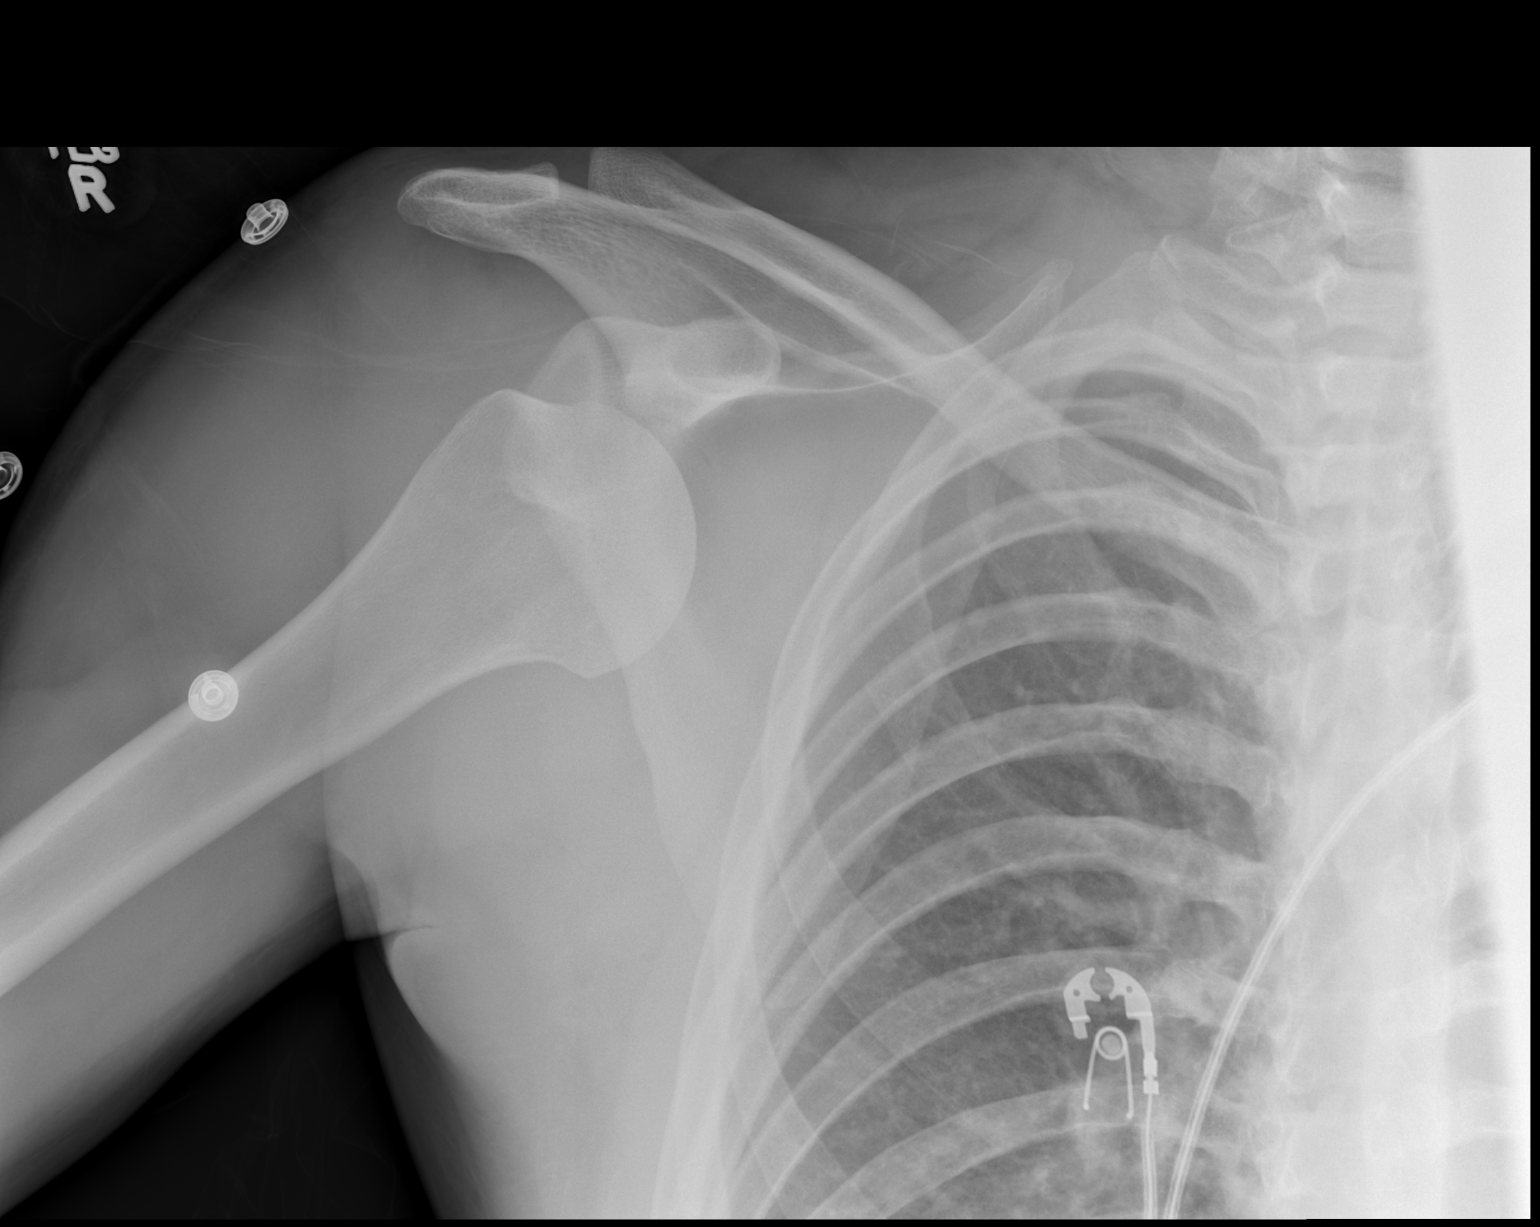

[x scapula y-view right]
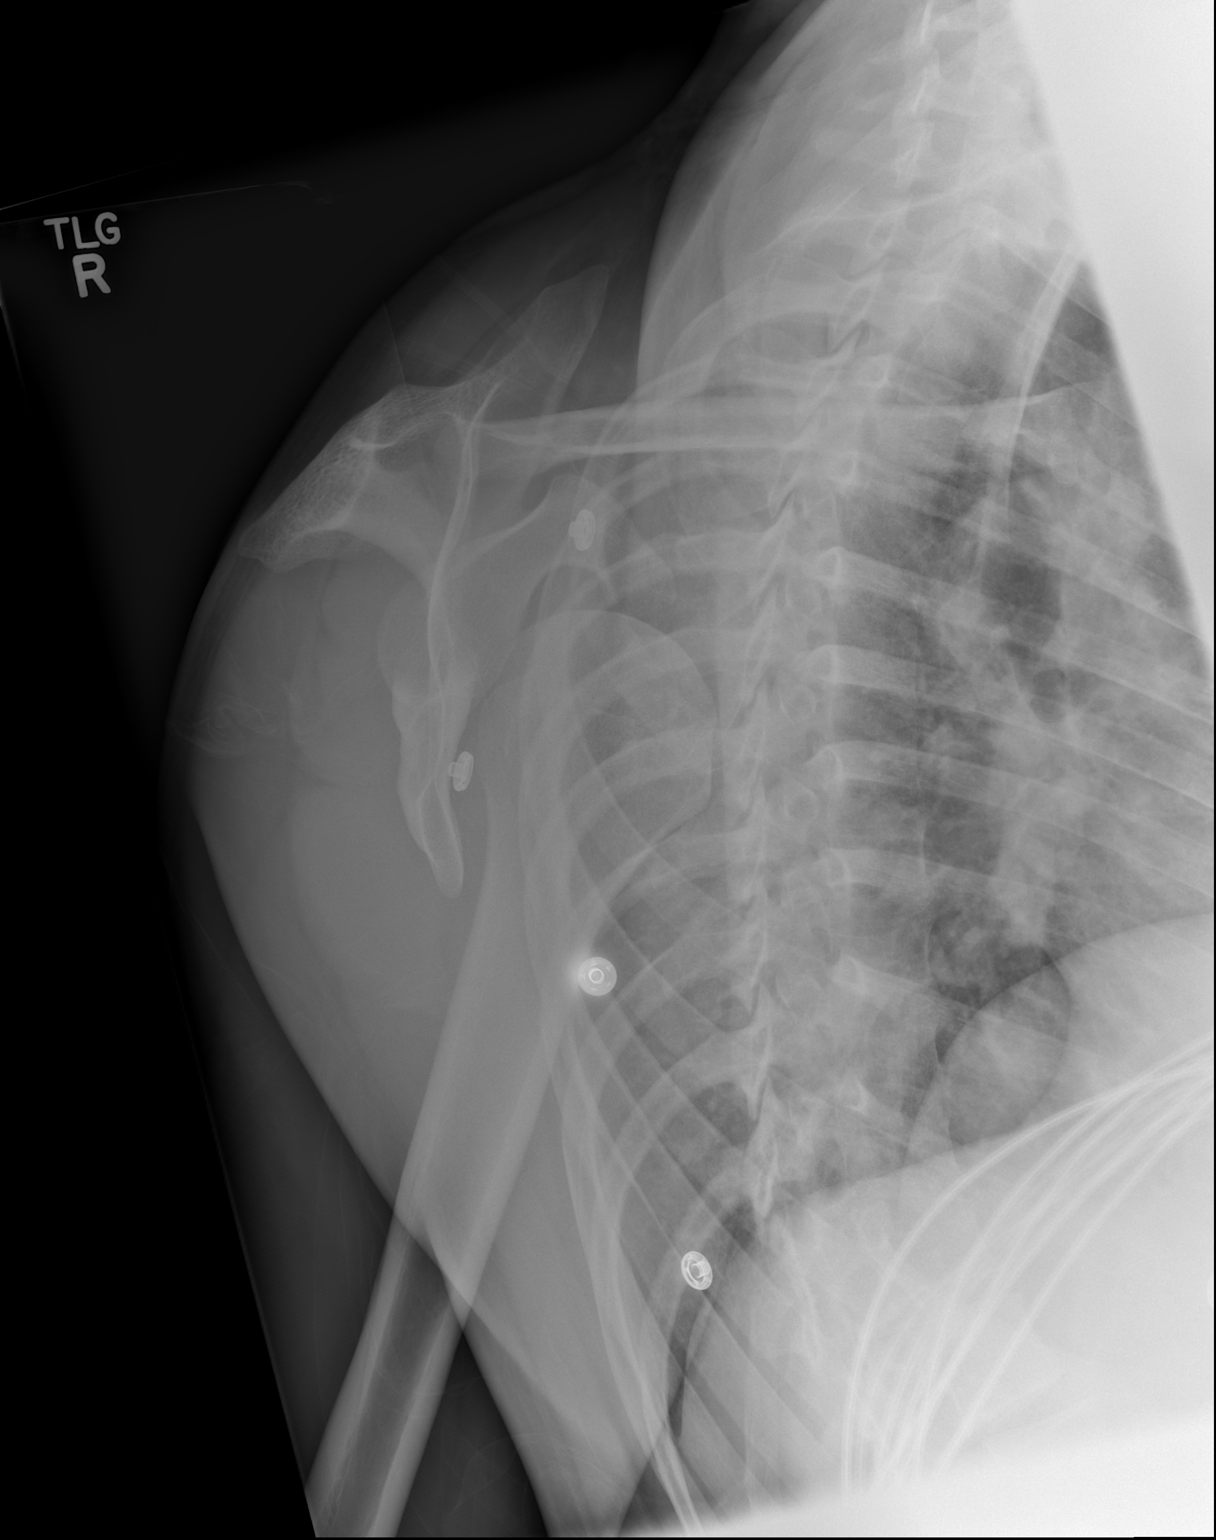

[2 of 2 positions shown; findings below may reference images not displayed]

FINDINGS: There is an anterior shoulder dislocation. No evidence for acute
fracture. Normal alignment at the right AC joint. Visualized right
ribs are intact.
IMPRESSION: Anterior right shoulder dislocation.
